# Patient Record
Sex: Female | Born: 1990 | Race: White | Hispanic: No | Marital: Single | Smoking: Never smoker
Health system: Southern US, Community
[De-identification: ages and names within clinical notes are randomized; demographics above are authoritative.]

## PROBLEM LIST (undated history)

## (undated) HISTORY — PX: WISDOM TOOTH EXTRACTION: SHX21

---

## 2015-03-20 ENCOUNTER — Encounter: Payer: Self-pay | Admitting: Neurology

## 2015-03-20 ENCOUNTER — Telehealth: Payer: Self-pay | Admitting: Neurology

## 2015-03-20 ENCOUNTER — Telehealth: Payer: Self-pay | Admitting: Family Medicine

## 2015-03-20 ENCOUNTER — Ambulatory Visit (INDEPENDENT_AMBULATORY_CARE_PROVIDER_SITE_OTHER): Payer: BLUE CROSS/BLUE SHIELD | Admitting: Neurology

## 2015-03-20 VITALS — BP 112/62 | HR 84 | Ht 63.0 in | Wt 226.0 lb

## 2015-03-20 DIAGNOSIS — R51 Headache: Secondary | ICD-10-CM

## 2015-03-20 DIAGNOSIS — H4710 Unspecified papilledema: Secondary | ICD-10-CM | POA: Diagnosis not present

## 2015-03-20 DIAGNOSIS — R519 Headache, unspecified: Secondary | ICD-10-CM | POA: Insufficient documentation

## 2015-03-20 NOTE — Telephone Encounter (Signed)
Pt Madison Kelly recalled an incident concerning hitting her head, but forgot when Dr. Karel JarvisAquino asked her about it during her last visit. Call back @ [phone]

## 2015-03-20 NOTE — Telephone Encounter (Signed)
Noted, thanks!

## 2015-03-20 NOTE — Addendum Note (Signed)
Addended by: Franciso BendMCNEIL, Reo Portela M on: 03/20/2015 03:25 PM   Modules accepted: Orders

## 2015-03-20 NOTE — Progress Notes (Addendum)
NEUROLOGY CONSULTATION NOTE  Madison Kelly MRN: [id number] DOB: [birth year]  Referring provider: Dr. Arn Medal Primary care provider: Dr. Arn Medal  Reason for consult:  papilledema  Dear Dr Imogene Burn:  Thank you for your kind referral of Madison Kelly for consultation of the above symptoms. Although her history is well known to you, please allow me to reiterate it for the purpose of our medical record. Records and images were personally reviewed where available.  HISTORY OF PRESENT ILLNESS: This is a 24 year old right-handed woman with no significant past medical history, presenting for evaluation of papilledema and headaches. She started having frequent headaches around 6 months ago with pressure and throbbing behind either eye, right>left. She noticed pain with eye movements. Headaches were occurring twice a week, then stopped in May after she graduated. She attributed them to stress, until she saw her optometrist mid-June for a routine eye exam and was found to have elevated discs bilaterally on dilated exam, confirmed on OCT. It was noted that the margins were fairly distinct with no vessel obscuration. She reports that since her eye doctor visit, she had back to back headaches last weekend, on and off until this morning lasting 24 hours at a time. Pain is around a 5 or 6 over 10, with associated nausea and photosensitivity. Goody powders and Ibuprofen did not help. The pain was mostly behind her right eye, however the pain yesterday was behind her left eye. She reports that with her headaches in the past, she did not notice much vision changes, but since she started reading up on pseudotumor cerebri, she feels her vision is slightly more blurred without her contacts. She denies any loss of vision. She started noticing intermittent pulsatile tinnitus around a month ago. She wonders about work (heavy lifting) being a trigger, otherwise no other clear triggers. She gets 6-8 hours of refreshing sleep.  She has had recent weight gain from 170 lbs to 226 lbs in the past 6-8 months, with no change in her eating habits. She has tried healthy eating and dieting without much success. She denies any focal numbness/tingling/weakness, no neck pain, diplopia, dizziness, bowel/bladder dysfunction. She has occasional back pain, and noticed she had bad back pain with her headache last Monday. No family history of headaches. She denies any medication intake, no oral contraceptive intake. No head injuries.  PAST MEDICAL HISTORY: No past medical history on file.  PAST SURGICAL HISTORY: Past Surgical History  Procedure Laterality Date  . Wisdom tooth extraction      MEDICATIONS: No current outpatient prescriptions on file prior to visit.   No current facility-administered medications on file prior to visit.    ALLERGIES: No Known Allergies  FAMILY HISTORY: Family History  Problem Relation Age of Onset  . Hypertension Father   . Diabetes Father     SOCIAL HISTORY: History   Social History  . Marital Status: Single    Spouse Name: N/A  . Number of Children: N/A  . Years of Education: N/A   Occupational History  . Not on file.   Social History Main Topics  . Smoking status: Never Smoker   . Smokeless tobacco: Not on file  . Alcohol Use: 0.0 oz/week    0 Standard drinks or equivalent per week     Comment: once a month at the most   . Drug Use: No  . Sexual Activity: Not on file   Other Topics Concern  . Not on file   Social History Narrative  .  No narrative on file    REVIEW OF SYSTEMS: Constitutional: No fevers, chills, or sweats, no generalized fatigue, change in appetite Eyes: No visual changes, double vision, eye pain Ear, nose and throat: No hearing loss, ear pain, nasal congestion, sore throat Cardiovascular: No chest pain, palpitations Respiratory:  No shortness of breath at rest or with exertion, wheezes GastrointestinaI: No nausea, vomiting, diarrhea, abdominal  pain, fecal incontinence Genitourinary:  No dysuria, urinary retention or frequency Musculoskeletal:  No neck pain,+ back pain Integumentary: No rash, pruritus, skin lesions Neurological: as above Psychiatric: No depression, insomnia, anxiety Endocrine: No palpitations, fatigue, diaphoresis, mood swings, change in appetite, change in weight, increased thirst Hematologic/Lymphatic:  No anemia, purpura, petechiae. Allergic/Immunologic: no itchy/runny eyes, nasal congestion, recent allergic reactions, rashes  PHYSICAL EXAM: Filed Vitals:   03/20/15 1245  BP: 112/62  Pulse: 84   General: No acute distress Head:  Normocephalic/atraumatic Eyes: Fundoscopic exam shows blurred discs bilaterally, +SVP seen Neck: supple, no paraspinal tenderness, full range of motion Back: No paraspinal tenderness Heart: regular rate and rhythm Lungs: Clear to auscultation bilaterally. Vascular: No carotid bruits. Skin/Extremities: No rash, no edema Neurological Exam: Mental status: alert and oriented to person, place, and time, no dysarthria or aphasia, Fund of knowledge is appropriate.  Recent and remote memory are intact.  Attention and concentration are normal.    Able to name objects and repeat phrases. Cranial nerves: CN I: not tested CN II: pupils equal, round and reactive to light, visual fields intact, fundi unremarkable. CN III, IV, VI:  full range of motion, no nystagmus, no ptosis CN V: facial sensation intact CN VII: upper and lower face symmetric CN VIII: hearing intact to finger rub CN IX, X: gag intact, uvula midline CN XI: sternocleidomastoid and trapezius muscles intact CN XII: tongue midline Bulk & Tone: normal, no fasciculations. Motor: 5/5 throughout with no pronator drift. Sensation: intact to light touch, cold, pin, vibration and joint position sense.  No extinction to double simultaneous stimulation.  Romberg test negative Deep Tendon Reflexes: +2 throughout, no ankle  clonus Plantar responses: downgoing bilaterally Cerebellar: no incoordination on finger to nose, heel to shin. No dysdiadochokinesia Gait: narrow-based and steady, able to tandem walk adequately. Tremor: none  IMPRESSION: This is a 24 year old right-handed woman with no significant medical history presenting for new onset headaches,pulsatile tinnitus, and bilateral papilledema on recent eye exam. Her neurological exam today is normal except for slight blurring of optic discs bilaterally. MRI brain and MRV head without contrast will be ordered to assess for underlying structural abnormality. She will be scheduled for a lumbar puncture to measure opening pressure. We discussed the benefits of weight loss, she will speak to her PCP about weight gain despite good diet/no change in eating habits. She will continue to monitor for any vision changes. She will follow-up after the tests, at which point we will decide on treatment plan depending on results. She knows to call us for any changes in symptoms.   Thank you for allowing me to participate in the care of this patient. Please do not hesitate to call for any questions or concerns.   Patrcia DollyKaren Aquino, M.D.  CC: Dr. Imogene Burnhen   ADDENDUM: Patient called after her visit to report that in December, she was playing flag football and was tackled hard and hit her head. She noticed that headaches started after that.

## 2015-03-20 NOTE — Telephone Encounter (Signed)
Left msg on her voicemail. [facility] for LP. Patient has been scheduled for 04/06/15 @ 1:45pm [address] location [phone]. Nothing to eat 4 hours prior to testing. She may have any liquids up to appt time. Will have to be on bed rest 24 hours after testing. Asked patient to call me or [facility] if she had any further questions about LP.

## 2015-03-20 NOTE — Telephone Encounter (Signed)
She states that she forgot to mention. That back in December she was playing flag football with friends and she was tackled pretty hard and when she went down she hit her head. She noticed after that her headaches started. She wanted make you aware so that it could be notated.

## 2015-03-20 NOTE — Telephone Encounter (Signed)
Lmovm to return my call. 

## 2015-03-20 NOTE — Patient Instructions (Signed)
1. Schedule MRI brain without contrast 2. Schedule MRV head without head 3. Schedule lumbar puncture under fluoroscopy 4. Follow-up after tests

## 2015-03-31 ENCOUNTER — Telehealth: Payer: Self-pay | Admitting: Neurology

## 2015-04-01 ENCOUNTER — Ambulatory Visit (HOSPITAL_COMMUNITY)
Admission: RE | Admit: 2015-04-01 | Discharge: 2015-04-01 | Disposition: A | Discharge status: Home / Self Care | Payer: BLUE CROSS/BLUE SHIELD | Source: Ambulatory Visit | Attending: Neurology | Admitting: Neurology

## 2015-04-01 ENCOUNTER — Ambulatory Visit (HOSPITAL_COMMUNITY): Payer: BLUE CROSS/BLUE SHIELD

## 2015-04-01 DIAGNOSIS — R110 Nausea: Secondary | ICD-10-CM | POA: Insufficient documentation

## 2015-04-01 DIAGNOSIS — R479 Unspecified speech disturbances: Secondary | ICD-10-CM | POA: Insufficient documentation

## 2015-04-01 DIAGNOSIS — R51 Headache: Secondary | ICD-10-CM | POA: Insufficient documentation

## 2015-04-02 NOTE — Telephone Encounter (Signed)
error 

## 2015-04-03 ENCOUNTER — Telehealth: Payer: Self-pay | Admitting: Family Medicine

## 2015-04-03 NOTE — Telephone Encounter (Signed)
Patient was notified of results & advisement. 

## 2015-04-03 NOTE — Telephone Encounter (Signed)
-----   Message from Van ClinesKaren M Aquino, MD sent at 04/02/2015  8:23 AM EDT ----- Pls let patient know I have reviewed MRI brain and it is normal, no evidence of tumor, stroke, bleed, or clot. Proceed with lumbar puncture as discussed. Thanks

## 2015-04-06 ENCOUNTER — Other Ambulatory Visit: Payer: BLUE CROSS/BLUE SHIELD

## 2015-04-07 ENCOUNTER — Other Ambulatory Visit: Payer: Self-pay | Admitting: Neurology

## 2015-04-07 ENCOUNTER — Ambulatory Visit
Admission: RE | Admit: 2015-04-07 | Discharge: 2015-04-07 | Disposition: A | Discharge status: Home / Self Care | Payer: BLUE CROSS/BLUE SHIELD | Source: Ambulatory Visit | Attending: Neurology | Admitting: Neurology

## 2015-04-07 DIAGNOSIS — H4710 Unspecified papilledema: Secondary | ICD-10-CM

## 2015-04-07 DIAGNOSIS — R51 Headache: Secondary | ICD-10-CM

## 2015-04-07 DIAGNOSIS — R519 Headache, unspecified: Secondary | ICD-10-CM

## 2015-04-07 LAB — PROTEIN, CSF: Total Protein, CSF: 23 mg/dL (ref 15–45)

## 2015-04-07 LAB — GLUCOSE, CSF: Glucose, CSF: 56 mg/dL (ref 43–76)

## 2015-04-07 NOTE — Discharge Instructions (Signed)

## 2015-04-08 ENCOUNTER — Telehealth: Payer: Self-pay | Admitting: Family Medicine

## 2015-04-08 ENCOUNTER — Telehealth: Payer: Self-pay | Admitting: Neurology

## 2015-04-08 DIAGNOSIS — G932 Benign intracranial hypertension: Secondary | ICD-10-CM

## 2015-04-08 LAB — CSF CELL COUNT WITH DIFFERENTIAL
RBC Count, CSF: 4 uL — ABNORMAL HIGH
Tube #: 3
WBC, CSF: 0 uL (ref 0–5)

## 2015-04-08 MED ORDER — ACETAZOLAMIDE 250 MG PO TABS: ORAL_TABLET | ORAL | Status: DC

## 2015-04-08 NOTE — Telephone Encounter (Signed)
Patient called back and i gave her the updated instuctions

## 2015-04-08 NOTE — Telephone Encounter (Signed)
-----   Message from Van ClinesKaren M Aquino, MD sent at 04/08/2015  8:44 AM EDT ----- Regarding: med instructions Tiff,  I had called her about results of LP and starting Diamox, but thought that there were 500mg  tablets. Initially told her to do 1/2 tab BID x 1 week, then increase to 1 tab BID.  There are no 500mg  tabs, only 250mg  tabs, so can you pls let her know of modification in instructions to take 1 tab BID x 1 week, then 2 tabs BID. Rx already sent to pharm.  Thank you very much!! Clydie BraunKaren

## 2015-04-08 NOTE — Telephone Encounter (Signed)
lmovm to return my call  °

## 2015-04-08 NOTE — Telephone Encounter (Signed)
Discussed LP results with patient, opening pressure elevated 27 cm H2O. Imaging normal. Discussed starting Diamox, side effects. She will start  twice a day for 1 week, then increase to  BID. Patient knows to call for any problems

## 2015-04-09 LAB — CRYPTOCOCCAL AG, LTX SCR RFLX TITER: CRYPTOCOCCAL AG SCREEN: NOT DETECTED

## 2015-04-10 LAB — CSF CULTURE W GRAM STAIN: Gram Stain: NONE SEEN

## 2015-04-10 LAB — CSF CULTURE
GRAM STAIN: NONE SEEN
ORGANISM ID, BACTERIA: NO GROWTH

## 2015-04-14 ENCOUNTER — Encounter: Payer: Self-pay | Admitting: Neurology

## 2015-04-14 ENCOUNTER — Ambulatory Visit (INDEPENDENT_AMBULATORY_CARE_PROVIDER_SITE_OTHER): Payer: BLUE CROSS/BLUE SHIELD | Admitting: Neurology

## 2015-04-14 VITALS — BP 90/70 | HR 68 | Resp 16 | Ht 63.0 in | Wt 217.0 lb

## 2015-04-14 DIAGNOSIS — G932 Benign intracranial hypertension: Secondary | ICD-10-CM | POA: Diagnosis not present

## 2015-04-14 DIAGNOSIS — E669 Obesity, unspecified: Secondary | ICD-10-CM

## 2015-04-14 NOTE — Progress Notes (Signed)
NEUROLOGY FOLLOW UP OFFICE NOTE  Madison Kelly [id number]  HISTORY OF PRESENT ILLNESS: I had the pleasure of seeing Madison Kelly in follow-up in the neurology clinic on 04/14/2015.  The patient was last seen 3 weeks ago after her eye doctor noted papilledema on exam. Records and images were personally reviewed where available. I personally reviewed MRI brain and MRV head which did not show any acute changes. She had a lumbar puncture with elevated opening pressure of 27 cm H2O. She had a bad headache the day after the LP, but since then denies any further headaches. She was reporting pressure behind her eyes, this has gone down, she does not feel a lot of pressure anymore. She feels her vision is a little less blurry. She has so far taken 3 doses of Diamox 250mg , and had a 5-10 minute episode of bilateral hand numbness about an hour after taking the second dose. Otherwise she denies any dizziness, loss of vision, focal weakness. She reports having bloodwork done with PCP, thyroid function normal. She is looking into weight loss supplements.   HPI: This is a 24 yo RH woman with no significant past medical history with papilledema and headaches. She started having frequent headaches around January 2016 with pressure and throbbing behind either eye, right>left. She noticed pain with eye movements. Headaches were occurring twice a week, then stopped in May after she graduated. She attributed them to stress, until she saw her optometrist mid-June for a routine eye exam and was found to have elevated discs bilaterally on dilated exam, confirmed on OCT. It was noted that the margins were fairly distinct with no vessel obscuration. She reports that since her eye doctor visit, she had back to back headaches last weekend, on and off until this morning lasting 24 hours at a time. Pain is around a 5 or 6 over 10, with associated nausea and photosensitivity. Goody powders and Ibuprofen did not help. The pain was mostly  behind her right eye, however the pain yesterday was behind her left eye. She reports that with her headaches in the past, she did not notice much vision changes, but since she started reading up on pseudotumor cerebri, she feels her vision is slightly more blurred without her contacts. She denies any loss of vision. She started noticing intermittent pulsatile tinnitus around a month ago. She wonders about work (heavy lifting) being a trigger, otherwise no other clear triggers. She gets 6-8 hours of refreshing sleep. She has had recent weight gain from 170 lbs to 226 lbs in the past 6-8 months, with no change in her eating habits. She has tried healthy eating and dieting without much success. No family history of headaches. She denies any medication intake, no oral contraceptive intake. No head injuries.  PAST MEDICAL HISTORY: No past medical history on file.  MEDICATIONS: Current Outpatient Prescriptions on File Prior to Visit  Medication Sig Dispense Refill  . acetaZOLAMIDE (DIAMOX) 250 MG tablet Take 1 tablet twice a day for 1 week, then increase to 2 tablets twice a day 120 tablet 3   No current facility-administered medications on file prior to visit.    ALLERGIES: No Known Allergies  FAMILY HISTORY: Family History  Problem Relation Age of Onset  . Hypertension Father   . Diabetes Father     SOCIAL HISTORY: History   Social History  . Marital Status: Single    Spouse Name: N/A  . Number of Children: N/A  . Years of Education: N/A  Occupational History  . Not on file.   Social History Main Topics  . Smoking status: Never Smoker   . Smokeless tobacco: Not on file  . Alcohol Use: 0.0 oz/week    0 Standard drinks or equivalent per week     Comment: once a month at the most   . Drug Use: No  . Sexual Activity: Not on file   Other Topics Concern  . Not on file   Social History Narrative    REVIEW OF SYSTEMS: Constitutional: No fevers, chills, or sweats, no  generalized fatigue, change in appetite Eyes: No visual changes, double vision, eye pain Ear, nose and throat: No hearing loss, ear pain, nasal congestion, sore throat Cardiovascular: No chest pain, palpitations Respiratory:  No shortness of breath at rest or with exertion, wheezes GastrointestinaI: No nausea, vomiting, diarrhea, abdominal pain, fecal incontinence Genitourinary:  No dysuria, urinary retention or frequency Musculoskeletal:  No neck pain, back pain Integumentary: No rash, pruritus, skin lesions Neurological: as above Psychiatric: No depression, insomnia, anxiety Endocrine: No palpitations, fatigue, diaphoresis, mood swings, change in appetite, change in weight, increased thirst Hematologic/Lymphatic:  No anemia, purpura, petechiae. Allergic/Immunologic: no itchy/runny eyes, nasal congestion, recent allergic reactions, rashes  PHYSICAL EXAM: Filed Vitals:   04/14/15 1126  BP: 90/70  Pulse: 68  Resp: 16   General: No acute distress Head:  Normocephalic/atraumatic Neck: supple, no paraspinal tenderness, full range of motion Heart:  Regular rate and rhythm Lungs:  Clear to auscultation bilaterally Back: No paraspinal tenderness Skin/Extremities: No rash, no edema Neurological Exam: alert and oriented to person, place, and time. No aphasia or dysarthria. Fund of knowledge is appropriate.  Recent and remote memory are intact.  Attention and concentration are normal.    Able to name objects and repeat phrases. Cranial nerves: Pupils equal, round, reactive to light.  Fundoscopic exam unremarkable, no papilledema, SVP seen bilaterally. Extraocular movements intact with no nystagmus. Visual fields full. Facial sensation intact. No facial asymmetry. Tongue, uvula, palate midline.  Motor: Bulk and tone normal, muscle strength 5/5 throughout with no pronator drift.  Sensation to light touch intact.  No extinction to double simultaneous stimulation.  Deep tendon reflexes 2+ throughout,  toes downgoing.  Finger to nose testing intact.  Gait narrow-based and steady, able to tandem walk adequately.  Romberg negative.  IMPRESSION: This is a 24 yo RH woman with obesity, presented for new onset headaches,pulsatile tinnitus, and bilateral papilledema on recent eye exam. MRI/MRV brain normal, CSF opening pressure elevated at 27 cm H2O, indicating idiopathic intracranial hypertension (IIH). She is now on uptitrating schedule of Diamox  BID, to increase to  BID next week. She has noticed an improvement in head pressure and blurred vision. 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 We again discussed the importance of weight loss with IIH, she is looking into starting weight loss supplements, no contraindication from neurological standpoint. She will follow-up when she returns to [facility] and knows to call our office for any changes.   Thank you for allowing me to participate in her care.  Please do not hesitate to call for any questions or concerns.  The duration of this appointment visit was 14 minutes of face-to-face time with the patient.  Greater than 50% of this time was spent in counseling, explanation of diagnosis, planning of further management, and coordination of care.   Patrcia Dolly, M.D.

## 2015-04-14 NOTE — Patient Instructions (Signed)
1. Continue with schedule of increase Diamox to  twice a day over the next week 2. Follow-up with eye doctor in 3 months 3. Follow-up when you get back in January, call our office for any problems

## 2015-04-23 ENCOUNTER — Telehealth: Payer: Self-pay | Admitting: *Deleted

## 2015-04-23 NOTE — Telephone Encounter (Signed)
Please advise 

## 2015-04-23 NOTE — Telephone Encounter (Signed)
Patient called about a medication(Diamox) she has had shortness of breath every since she started this medication Call back number [phone]

## 2015-04-24 NOTE — Telephone Encounter (Signed)
I spoke with her she is going to reduce the Diamox. 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 She states she recently had bloodwork at her pcp's office and is going to have them fax that over.

## 2015-04-24 NOTE — Telephone Encounter (Signed)
Is there a place she can have bloodwork done? Would check a BMP. Have her reduce dose to 1 tab BID. Thanks

## 2015-06-19 ENCOUNTER — Telehealth: Payer: Self-pay | Admitting: Neurology

## 2015-06-19 ENCOUNTER — Other Ambulatory Visit: Payer: Self-pay | Admitting: Family Medicine

## 2015-06-19 DIAGNOSIS — G932 Benign intracranial hypertension: Secondary | ICD-10-CM

## 2015-06-19 MED ORDER — ACETAZOLAMIDE 250 MG PO TABS: ORAL_TABLET | ORAL | Status: DC

## 2015-06-19 NOTE — Telephone Encounter (Signed)
Pt returned your call/ call back @ [phone]

## 2015-06-19 NOTE — Addendum Note (Signed)
Addended by: Franciso Bend on: 06/19/2015 11:47 AM   Modules accepted: Orders

## 2015-06-19 NOTE — Telephone Encounter (Signed)
Lmovm to rtn my call. 

## 2015-06-19 NOTE — Telephone Encounter (Signed)
I spoke with patient. She needs a refill on her Diamox. 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 Rx faxed to [facility] [address] [place], [place]   Ph: [phone]        Fax: [phone]

## 2015-06-19 NOTE — Telephone Encounter (Signed)
Felicia, please send refill messages to [facility], thanks

## 2015-06-19 NOTE — Telephone Encounter (Signed)
Pt called for a refill of med/ Acetazolamide//call back @ [phone]

## 2015-08-03 NOTE — Telephone Encounter (Signed)
Error

## 2015-10-05 ENCOUNTER — Encounter: Payer: Self-pay | Admitting: Neurology

## 2015-10-05 ENCOUNTER — Ambulatory Visit (INDEPENDENT_AMBULATORY_CARE_PROVIDER_SITE_OTHER): Payer: BLUE CROSS/BLUE SHIELD | Admitting: Neurology

## 2015-10-05 VITALS — BP 104/74 | HR 80 | Ht 63.0 in | Wt 206.0 lb

## 2015-10-05 DIAGNOSIS — G932 Benign intracranial hypertension: Secondary | ICD-10-CM

## 2015-10-05 MED ORDER — ACETAZOLAMIDE 250 MG PO TABS: ORAL_TABLET | ORAL | Status: DC

## 2015-10-05 NOTE — Patient Instructions (Addendum)
1. Continue Diamox 250mg  twice a day 2. Please forward your bloodwork results to our office 3. Records from [facility] [address] will be requested for review 4. Follow-up in 1 year, call for any changes, let us know if you [place] in [place] and we will send records.

## 2015-10-05 NOTE — Progress Notes (Addendum)
NEUROLOGY FOLLOW UP OFFICE NOTE  Madison Kelly [id number]  HISTORY OF PRESENT ILLNESS: I had the pleasure of seeing Madison Kelly in follow-up in the neurology clinic on 10/05/2015.  The patient was last seen 6 months ago for pseudotumor cerebri. Her eye doctor had initially noted papilledema on exam. MRI brain and MRV head which did not show any acute changes. She had a lumbar puncture with elevated opening pressure of 27 cm H2O. She was started on Diamox but did not tolerate 500mg  BID, reporting shortness of breath. This improved on lower dose 250mg  BID. She only has a brief headache if she forgets her morning dose of Diamox. Her vision is better as well, she noticed a huge difference after the lumbar puncture, and has seen her eye doctor recently and been told that her eye exam is better than before. When she is not wearing contacts, vision occasionally gets blurred. She denies any further tinnitus. She has occasional tingling in her feet. She denies any falls. She has had 2 brief episodes of dizziness when she looked down then up, where she felt like she was falling over. No associated nausea or vomiting.   HPI: This is a 25 yo RH woman with no significant past medical history with papilledema and headaches. She started having frequent headaches around January 2016 with pressure and throbbing behind either eye, right>left. She noticed pain with eye movements. Headaches were occurring twice a week, then stopped in May after she graduated. She attributed them to stress, until she saw her optometrist mid-June for a routine eye exam and was found to have elevated discs bilaterally on dilated exam, confirmed on OCT. It was noted that the margins were fairly distinct with no vessel obscuration. She reports that since her eye doctor visit, she had back to back headaches last weekend, on and off until this morning lasting 24 hours at a time. Pain is around a 5 or 6 over 10, with associated nausea and  photosensitivity. Goody powders and Ibuprofen did not help. The pain was mostly behind her right eye, however the pain yesterday was behind her left eye. She reports that with her headaches in the past, she did not notice much vision changes, but since she started reading up on pseudotumor cerebri, she feels her vision is slightly more blurred without her contacts. She denies any loss of vision. She started noticing intermittent pulsatile tinnitus around a month ago. She wonders about work (heavy lifting) being a trigger, otherwise no other clear triggers. She gets 6-8 hours of refreshing sleep. She has had recent weight gain from 170 lbs to 226 lbs in the past 6-8 months, with no change in her eating habits. She has tried healthy eating and dieting without much success. No family history of headaches. She denies any medication intake, no oral contraceptive intake. No head injuries.  PAST MEDICAL HISTORY: No past medical history on file.  MEDICATIONS: Current Outpatient Prescriptions on File Prior to Visit  Medication Sig Dispense Refill  . acetaZOLAMIDE (DIAMOX) 250 MG tablet Take 1 tablet twice a day 60 tablet 4   No current facility-administered medications on file prior to visit.    ALLERGIES: No Known Allergies  FAMILY HISTORY: Family History  Problem Relation Age of Onset  . Hypertension Father   . Diabetes Father     SOCIAL HISTORY: Social History   Social History  . Marital Status: Single    Spouse Name: N/A  . Number of Children: N/A  . Years of  Education: N/A   Occupational History  . Not on file.   Social History Main Topics  . Smoking status: Never Smoker   . Smokeless tobacco: Not on file  . Alcohol Use: 0.0 oz/week    0 Standard drinks or equivalent per week     Comment: once a month at the most   . Drug Use: No  . Sexual Activity: Not on file   Other Topics Concern  . Not on file   Social History Narrative    REVIEW OF SYSTEMS: Constitutional: No  fevers, chills, or sweats, no generalized fatigue, change in appetite Eyes: No visual changes, double vision, eye pain Ear, nose and throat: No hearing loss, ear pain, nasal congestion, sore throat Cardiovascular: No chest pain, palpitations Respiratory:  No shortness of breath at rest or with exertion, wheezes GastrointestinaI: No nausea, vomiting, diarrhea, abdominal pain, fecal incontinence Genitourinary:  No dysuria, urinary retention or frequency Musculoskeletal:  No neck pain, back pain Integumentary: No rash, pruritus, skin lesions Neurological: as above Psychiatric: No depression, insomnia, anxiety Endocrine: No palpitations, fatigue, diaphoresis, mood swings, change in appetite, change in weight, increased thirst Hematologic/Lymphatic:  No anemia, purpura, petechiae. Allergic/Immunologic: no itchy/runny eyes, nasal congestion, recent allergic reactions, rashes  PHYSICAL EXAM: Filed Vitals:   10/05/15 1257  BP: 104/74  Pulse: 80   General: No acute distress Head:  Normocephalic/atraumatic Neck: supple, no paraspinal tenderness, full range of motion Heart:  Regular rate and rhythm Lungs:  Clear to auscultation bilaterally Back: No paraspinal tenderness Skin/Extremities: No rash, no edema Neurological Exam: alert and oriented to person, place, and time. No aphasia or dysarthria. Fund of knowledge is appropriate.  Recent and remote memory are intact.  Attention and concentration are normal.    Able to name objects and repeat phrases. Cranial nerves: Pupils equal, round, reactive to light.  Fundoscopic exam unremarkable, no papilledema, SVP seen bilaterally. Extraocular movements intact with no nystagmus. Visual fields full. Facial sensation intact. No facial asymmetry. Tongue, uvula, palate midline.  Motor: Bulk and tone normal, muscle strength 5/5 throughout with no pronator drift.  Sensation to light touch intact.  No extinction to double simultaneous stimulation.  Deep tendon  reflexes 2+ throughout, toes downgoing.  Finger to nose testing intact.  Gait narrow-based and steady, able to tandem walk adequately.  Romberg negative.  IMPRESSION: This is a 25 yo RH woman with obesity, who presented for new onset headaches,pulsatile tinnitus, and bilateral papilledema on eye exam. MRI/MRV brain normal, CSF opening pressure elevated at 27 cm H2O, indicating idiopathic intracranial hypertension (IIH). She is feeling better on Diamox 250mg  BID, did not tolerate 500mg  BID. Vision and headaches are better. Recent eye exam reported as better, no papilledema on exam today. Continue current dose of Diamox. Bloodwork from her PCP will be requested for review, as well as records from her eye doctor. She may be moving to [place] for a new job and will establish care with a neurologist there, otherwise follow-up in 1 year. She knows to call our office for any changes.   Thank you for allowing me to participate in her care.  Please do not hesitate to call for any questions or concerns.  The duration of this appointment visit was 25 minutes of face-to-face time with the patient.  Greater than 50% of this time was spent in counseling, explanation of diagnosis, planning of further management, and coordination of care.   Patrcia Dolly, M.D.  ADDENDUM: Report from [facility] reviewed for visit 09/28/15:  Optic nerve bilateral +distinct margins Impression: No signs of optic nerve edema today. Monitor yearly. To call if any changes.

## 2015-10-15 ENCOUNTER — Ambulatory Visit: Payer: BLUE CROSS/BLUE SHIELD | Admitting: Neurology

## 2016-02-12 ENCOUNTER — Telehealth: Payer: Self-pay | Admitting: Neurology

## 2016-02-12 DIAGNOSIS — G932 Benign intracranial hypertension: Secondary | ICD-10-CM

## 2016-02-12 MED ORDER — ACETAZOLAMIDE 250 MG PO TABS: ORAL_TABLET | ORAL | Status: AC

## 2016-02-12 NOTE — Telephone Encounter (Signed)
Pt needs a refill on the diamox she will need a 3 month supply please call  [facility] at [phone] pt phone number is [phone] [place]

## 2016-02-12 NOTE — Telephone Encounter (Signed)
Rx sent to pharmacy. Patient was notified.  

## 2016-02-18 ENCOUNTER — Encounter: Payer: Self-pay | Admitting: Family Medicine

## 2016-10-05 ENCOUNTER — Ambulatory Visit: Payer: BLUE CROSS/BLUE SHIELD | Admitting: Neurology

## 2016-10-22 IMAGING — MR MR HEAD W/O CM
11 of 14 series · 27 of 48 positions shown · non-contrast
Comparison: None.

CLINICAL DATA: Headache. Speech disturbance. Nausea. Duration of
symptoms 6-7 months.

EXAM:
MRI HEAD WITHOUT CONTRAST
MRV HEAD WITHOUT CONTRAST
TECHNIQUE: Multiplanar, multiecho pulse sequences of the brain and surrounding
structures were obtained without intravenous contrast. Angiographic
images of the head were obtained using MRV technique without
contrast.

[Series 3: T1 · sagittal · 5.0mm · 0.47mm/px · 2 of 23 slices shown]
[im 1/23]
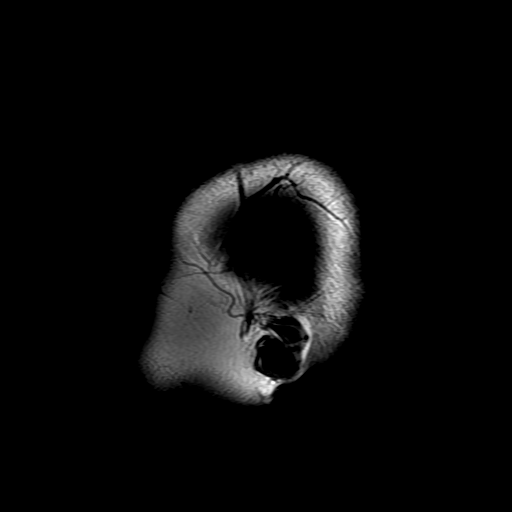
[im 23/23]
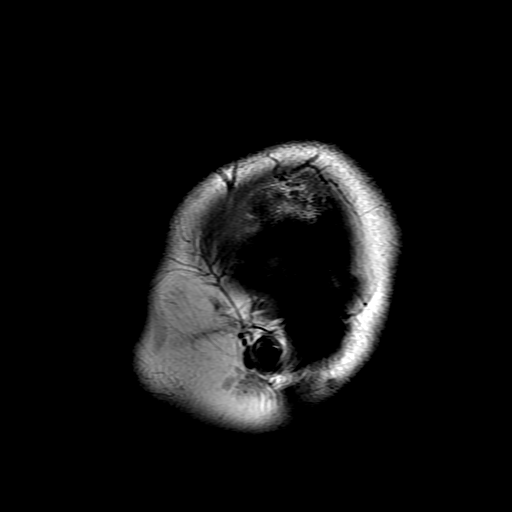

[Series 4: DWI · axial · 3.0mm · 1.09mm/px · z∈[-72,+71]mm · 5 of 98 slices shown (1 of 4)]
[im 1/98]
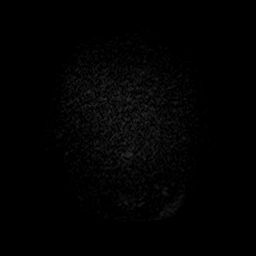
[im 25/98]
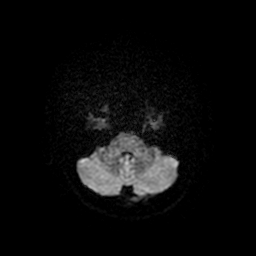
[im 49/98]
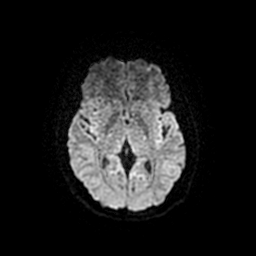
[im 73/98]
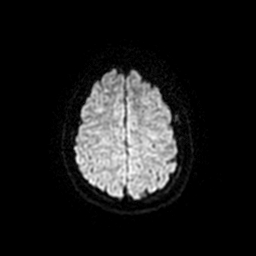
[im 98/98]
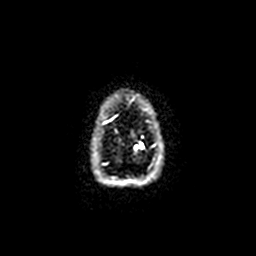

[Series 5: DWI · coronal · 5.0mm · 1.09mm/px · 3 of 66 slices shown (2 of 4)]
[im 1/66]
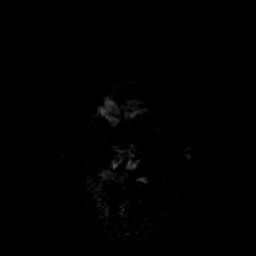
[im 33/66]
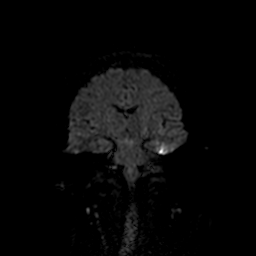
[im 66/66]
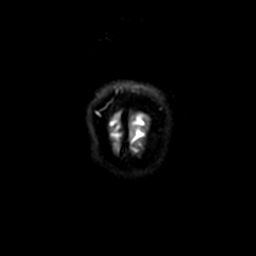

[Series 6: T2 · axial · 5.0mm · 0.43mm/px · 1 of 23 slices shown (1 of 2)]
[im 1/23]
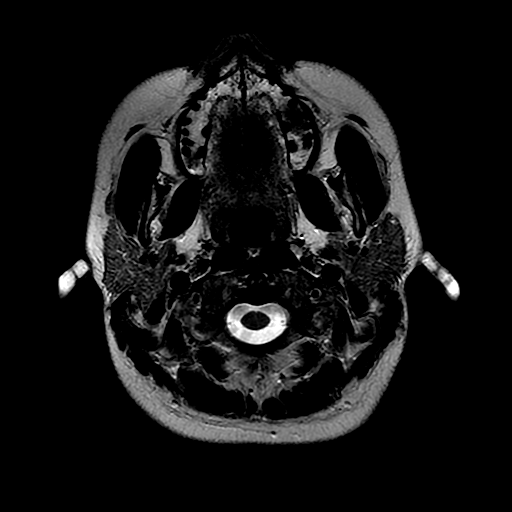

[Series 7: FLAIR · axial · 5.0mm · 0.43mm/px · 1 of 23 slices shown]
[im 1/23]
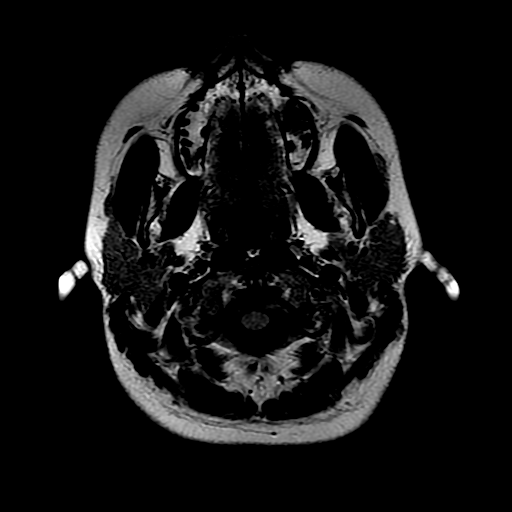

[Series 8: ax mpgr · axial · 5.0mm · 0.43mm/px · 1 of 23 slices shown]
[im 1/23]
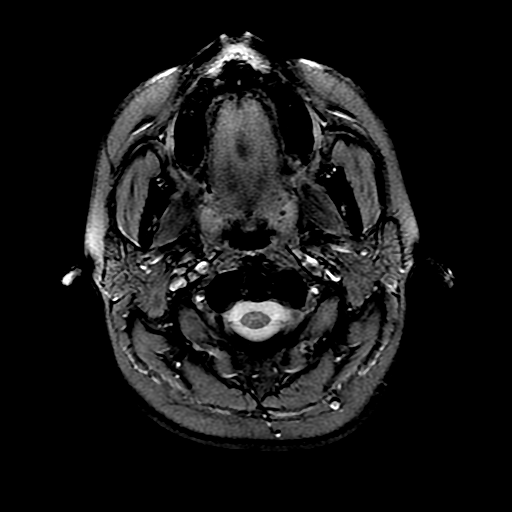

[Series 9: ax fspgr irp · axial · 3.0mm · 0.47mm/px · z∈[-78,+81]mm · 3 of 54 slices shown]
[im 1/54]
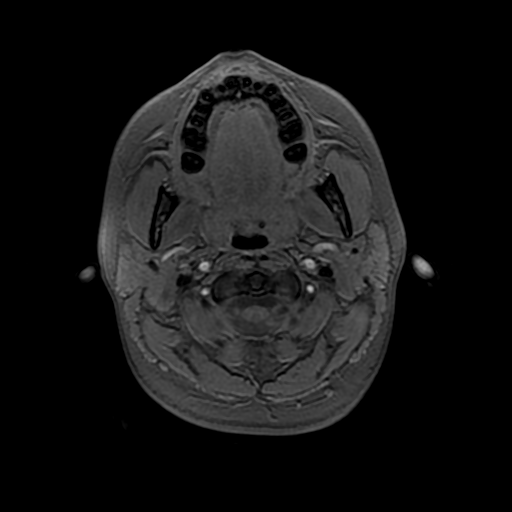
[im 27/54]
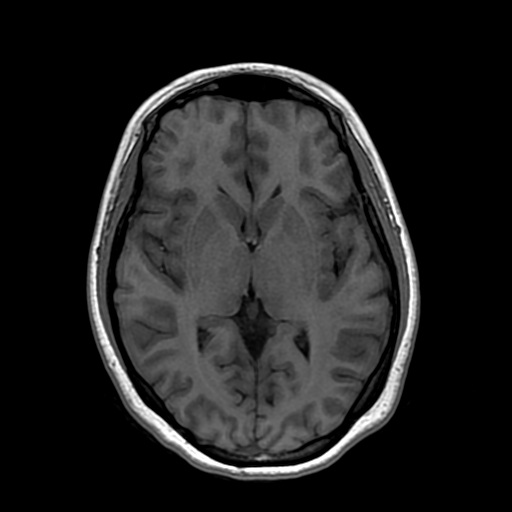
[im 54/54]
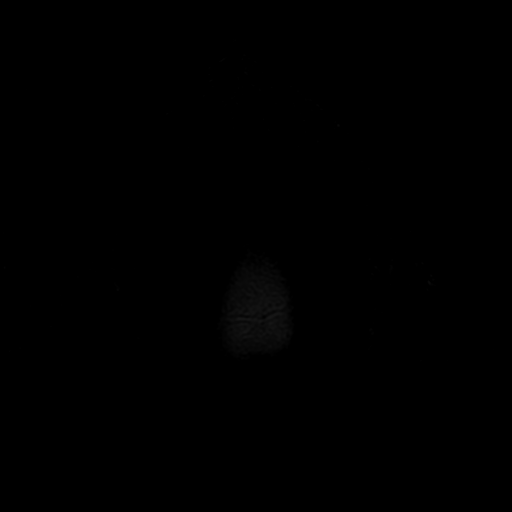

[Series 10: T2 · coronal · 5.0mm · 0.45mm/px · 1 of 26 slices shown (2 of 2)]
[im 1/26]
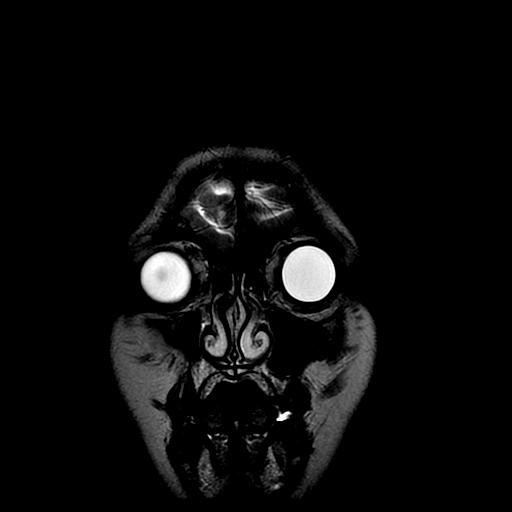

[Series 12: MRV · sagittal · 1.5mm · 0.47mm/px · 6 of 125 slices shown]
[im 1/125]
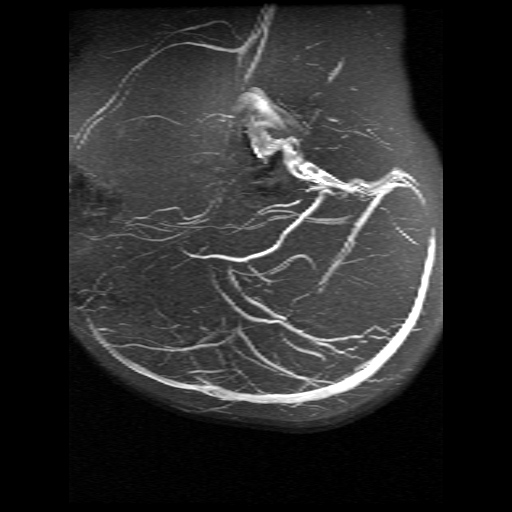
[im 25/125]
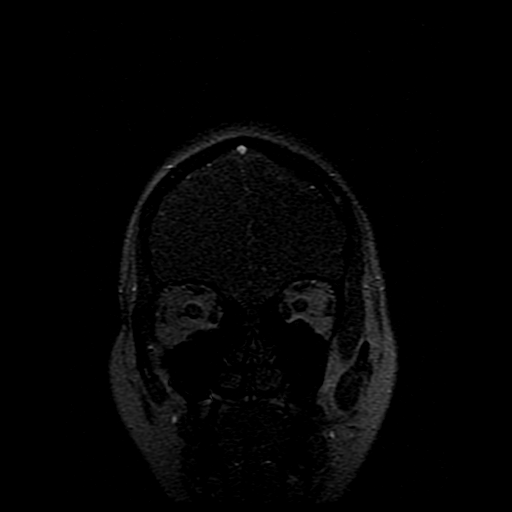
[im 50/125]
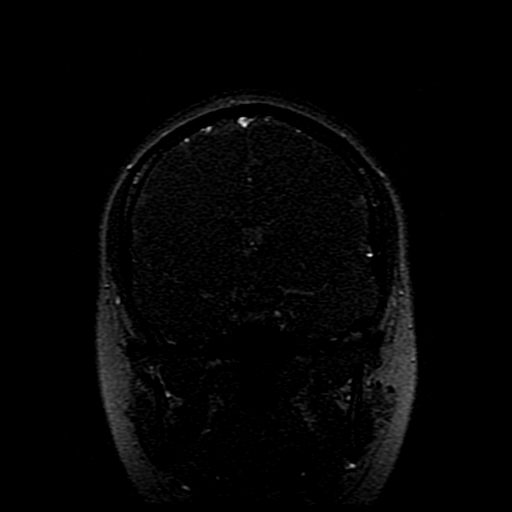
[im 75/125]
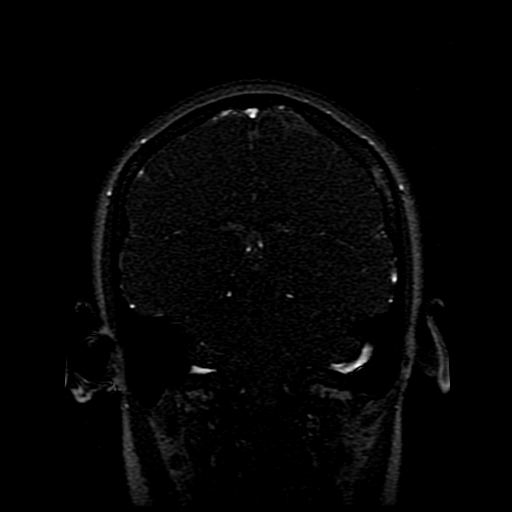
[im 100/125]
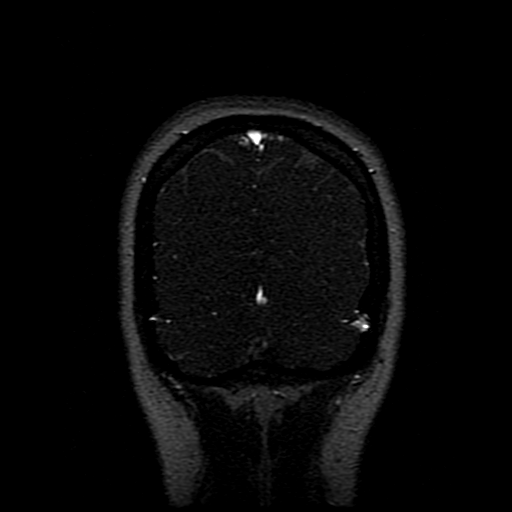
[im 125/125]
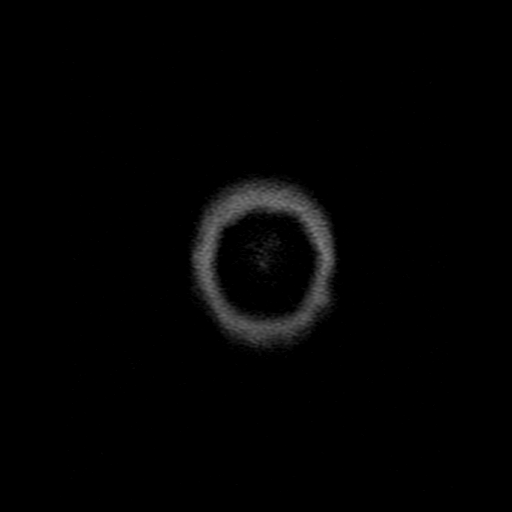

[Series 400: DWI · axial · 3.0mm · 1.09mm/px · z∈[-72,+71]mm · 2 of 49 slices shown (3 of 4)]
[im 1/49]
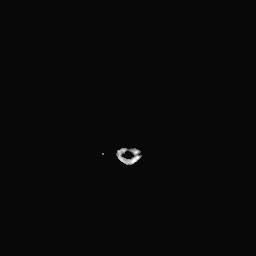
[im 49/49]
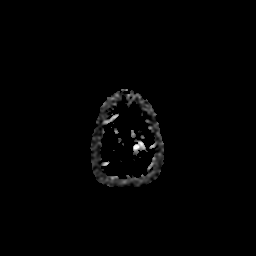

[Series 500: DWI · coronal · 5.0mm · 1.09mm/px · 2 of 33 slices shown (4 of 4)]
[im 1/33]
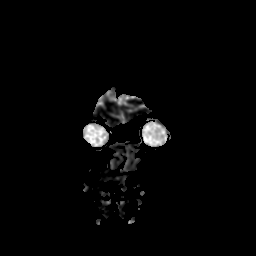
[im 33/33]
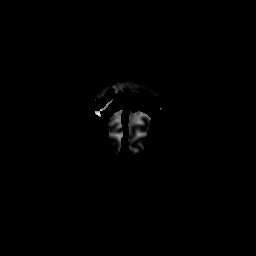

[27 of 48 positions shown; findings below may reference images not displayed]

FINDINGS: MRI HEAD FINDINGS

The brain has a normal appearance on all pulse sequences without
evidence of malformation, atrophy, old or acute infarction, mass
lesion, hemorrhage, hydrocephalus or extra-axial collection. No
pituitary mass. No fluid in the sinuses, middle ears or mastoids. No
skull or skullbase lesion. There is flow in the major vessels at the
base of the brain. Major venous sinuses show flow.

MRV HEAD FINDINGS

Superior sagittal sinus is widely patent. Both transverse sinuses
are patent and normal. Internal cerebral veins and straight sinus
are patent and normal. Both jugular veins show flow.
IMPRESSION: Normal MRI of the brain.  Normal intracranial MR venography.
